# Patient Record
Sex: Female | Born: 2000 | Race: Black or African American | Hispanic: No | Marital: Single | Smoking: Never smoker
Health system: Southern US, Community
[De-identification: ages and names within clinical notes are randomized; demographics above are authoritative.]

## PROBLEM LIST (undated history)

## (undated) DIAGNOSIS — J45909 Unspecified asthma, uncomplicated: Secondary | ICD-10-CM

## (undated) HISTORY — PX: NO PAST SURGERIES: SHX2092

---
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
## 2022-12-09 ENCOUNTER — Inpatient Hospital Stay (HOSPITAL_COMMUNITY)
Admission: AD | Admit: 2022-12-09 | Discharge: 2022-12-10 | Disposition: A | Discharge status: Home / Self Care | Payer: MEDICAID | Attending: Obstetrics and Gynecology | Admitting: Obstetrics and Gynecology

## 2022-12-09 ENCOUNTER — Encounter (HOSPITAL_COMMUNITY): Payer: Self-pay

## 2022-12-09 ENCOUNTER — Inpatient Hospital Stay (HOSPITAL_COMMUNITY): Payer: MEDICAID

## 2022-12-09 DIAGNOSIS — O9A212 Injury, poisoning and certain other consequences of external causes complicating pregnancy, second trimester: Secondary | ICD-10-CM | POA: Insufficient documentation

## 2022-12-09 DIAGNOSIS — Z3A16 16 weeks gestation of pregnancy: Secondary | ICD-10-CM | POA: Diagnosis not present

## 2022-12-09 DIAGNOSIS — N949 Unspecified condition associated with female genital organs and menstrual cycle: Secondary | ICD-10-CM

## 2022-12-09 DIAGNOSIS — R102 Pelvic and perineal pain: Secondary | ICD-10-CM | POA: Insufficient documentation

## 2022-12-09 DIAGNOSIS — S0083XA Contusion of other part of head, initial encounter: Secondary | ICD-10-CM | POA: Diagnosis not present

## 2022-12-09 DIAGNOSIS — O26892 Other specified pregnancy related conditions, second trimester: Secondary | ICD-10-CM | POA: Insufficient documentation

## 2022-12-09 DIAGNOSIS — R1031 Right lower quadrant pain: Secondary | ICD-10-CM | POA: Insufficient documentation

## 2022-12-09 HISTORY — DX: Unspecified asthma, uncomplicated: J45.909

## 2022-12-09 LAB — WET PREP, GENITAL
Clue Cells Wet Prep HPF POC: NONE SEEN
Sperm: NONE SEEN
Trich, Wet Prep: NONE SEEN
WBC, Wet Prep HPF POC: <10 (ref <10)
Yeast Wet Prep HPF POC: NONE SEEN

## 2022-12-09 LAB — URINALYSIS, ROUTINE W REFLEX MICROSCOPIC
Glucose, UA: NEGATIVE mg/dL
Hgb urine dipstick: NEGATIVE
Ketones, ur: >80 mg/dL — AB
Leukocytes,Ua: NEGATIVE
Nitrite: NEGATIVE
Protein, ur: NEGATIVE mg/dL
Specific Gravity, Urine: >1.03 — ABNORMAL HIGH (ref 1.005–1.030)
pH: 6 (ref 5.0–8.0)
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
## 2022-12-10 DIAGNOSIS — N949 Unspecified condition associated with female genital organs and menstrual cycle: Secondary | ICD-10-CM

## 2022-12-10 DIAGNOSIS — Z3A16 16 weeks gestation of pregnancy: Secondary | ICD-10-CM

## 2022-12-10 DIAGNOSIS — S0083XA Contusion of other part of head, initial encounter: Secondary | ICD-10-CM

## 2022-12-10 NOTE — MAU Note (Signed)
MAU registration called and stated pt came back with officer requesting AVS. RN reviewed discharge instructions - pt verbalized understanding. AVS packet handed to officer.

## 2022-12-10 NOTE — Progress Notes (Signed)
Pt left before receiving AVS.

## 2022-12-11 LAB — GC/CHLAMYDIA PROBE AMP (~~LOC~~) NOT AT ARMC
Chlamydia: NEGATIVE
Comment: NEGATIVE
Comment: NORMAL
Neisseria Gonorrhea: NEGATIVE
